# Patient Record
Sex: Female | Born: 1990 | Race: White | Hispanic: No | Smoking: Current every day smoker
Health system: Southern US, Community
[De-identification: ages and names within clinical notes are randomized; demographics above are authoritative.]

## PROBLEM LIST (undated history)

## (undated) HISTORY — PX: ECTOPIC PREGNANCY SURGERY: SHX613

---

## 2015-08-30 ENCOUNTER — Encounter (HOSPITAL_COMMUNITY): Payer: Self-pay

## 2015-08-30 DIAGNOSIS — R112 Nausea with vomiting, unspecified: Secondary | ICD-10-CM | POA: Diagnosis not present

## 2015-08-30 DIAGNOSIS — R42 Dizziness and giddiness: Secondary | ICD-10-CM | POA: Insufficient documentation

## 2015-08-30 DIAGNOSIS — R102 Pelvic and perineal pain: Secondary | ICD-10-CM | POA: Insufficient documentation

## 2015-08-30 DIAGNOSIS — R197 Diarrhea, unspecified: Secondary | ICD-10-CM | POA: Insufficient documentation

## 2015-08-30 DIAGNOSIS — F17200 Nicotine dependence, unspecified, uncomplicated: Secondary | ICD-10-CM | POA: Diagnosis not present

## 2015-08-30 DIAGNOSIS — Z3202 Encounter for pregnancy test, result negative: Secondary | ICD-10-CM | POA: Insufficient documentation

## 2015-08-30 DIAGNOSIS — R1030 Lower abdominal pain, unspecified: Secondary | ICD-10-CM | POA: Diagnosis present

## 2015-08-30 LAB — URINALYSIS, ROUTINE W REFLEX MICROSCOPIC
BILIRUBIN URINE: NEGATIVE
Glucose, UA: NEGATIVE mg/dL
KETONES UR: NEGATIVE mg/dL
Leukocytes, UA: NEGATIVE
NITRITE: NEGATIVE
PH: 5.5 (ref 5.0–8.0)
Protein, ur: NEGATIVE mg/dL
SPECIFIC GRAVITY, URINE: 1.025 (ref 1.005–1.030)

## 2015-08-30 LAB — URINE MICROSCOPIC-ADD ON

## 2015-08-30 LAB — COMPREHENSIVE METABOLIC PANEL
ALBUMIN: 4.6 g/dL (ref 3.5–5.0)
ALK PHOS: 49 U/L (ref 38–126)
ALT: 12 U/L — ABNORMAL LOW (ref 14–54)
AST: 17 U/L (ref 15–41)
Anion gap: 4 — ABNORMAL LOW (ref 5–15)
BILIRUBIN TOTAL: 0.5 mg/dL (ref 0.3–1.2)
BUN: 7 mg/dL (ref 6–20)
CALCIUM: 9.6 mg/dL (ref 8.9–10.3)
CO2: 27 mmol/L (ref 22–32)
Chloride: 105 mmol/L (ref 101–111)
Creatinine, Ser: 0.65 mg/dL (ref 0.44–1.00)
GFR calc Af Amer: >60 mL/min (ref >60)
GFR calc non Af Amer: >60 mL/min (ref >60)
GLUCOSE: 115 mg/dL — AB (ref 65–99)
Potassium: 3.5 mmol/L (ref 3.5–5.1)
SODIUM: 136 mmol/L (ref 135–145)
TOTAL PROTEIN: 7.2 g/dL (ref 6.5–8.1)

## 2015-08-30 LAB — CBC
HEMATOCRIT: 38.9 % (ref 36.0–46.0)
HEMOGLOBIN: 13.3 g/dL (ref 12.0–15.0)
MCH: 30.6 pg (ref 26.0–34.0)
MCHC: 34.2 g/dL (ref 30.0–36.0)
MCV: 89.4 fL (ref 78.0–100.0)
Platelets: 267 10*3/uL (ref 150–400)
RBC: 4.35 MIL/uL (ref 3.87–5.11)
RDW: 12.1 % (ref 11.5–15.5)
WBC: 8.3 10*3/uL (ref 4.0–10.5)

## 2015-08-30 LAB — LIPASE, BLOOD: Lipase: 21 U/L (ref 11–51)

## 2015-08-30 LAB — I-STAT BETA HCG BLOOD, ED (MC, WL, AP ONLY): I-stat hCG, quantitative: <5 m[IU]/mL (ref <5)

## 2015-08-30 NOTE — ED Notes (Signed)
Patient complains of severe lower abdominal pain and cramping since 530pm this evening. Has ovarian cyst but states that this pain is different. Vomited x 1. Some mild dysuria with same

## 2015-08-31 ENCOUNTER — Emergency Department (HOSPITAL_COMMUNITY)
Admission: EM | Admit: 2015-08-31 | Discharge: 2015-08-31 | Disposition: A | Discharge status: Home / Self Care | Payer: BLUE CROSS/BLUE SHIELD | Attending: Emergency Medicine | Admitting: Emergency Medicine

## 2015-08-31 DIAGNOSIS — R102 Pelvic and perineal pain: Secondary | ICD-10-CM

## 2015-08-31 MED ORDER — IBUPROFEN 800 MG PO TABS: 800.0000 mg | ORAL_TABLET | Freq: Three times a day (TID) | ORAL | Status: DC

## 2015-08-31 MED ORDER — OXYCODONE-ACETAMINOPHEN 5-325 MG PO TABS
1.0000 | ORAL_TABLET | Freq: Once | ORAL | Status: AC
Start: 2015-08-31 — End: 2015-08-31
  Administered 2015-08-31: 1 via ORAL
  Filled 2015-08-31: qty 1

## 2015-08-31 MED ORDER — TRAMADOL HCL 50 MG PO TABS: 50.0000 mg | ORAL_TABLET | Freq: Four times a day (QID) | ORAL | Status: DC | PRN

## 2015-08-31 NOTE — ED Provider Notes (Signed)
CSN: [id number]     Arrival date & time 08/30/15  2135 History  By signing my name below, I, Bethel Born, attest that this documentation has been prepared under the direction and in the presence of Gilda Crease, MD. Electronically Signed: Bethel Born, ED Scribe. 08/31/2015. 12:49 AM   Chief Complaint  Patient presents with  . Abdominal Pain   The history is provided by the patient. No language interpreter was used.   Samantha Stevens is a 25 y.o. female who presents to the Emergency Department complaining of severe, cramping, lower abdominal pain with onset gradual onset approximately 7 hours ago at work. Pt states that the pain feels similar to what she had with ovarian cysts in 2013. She took ibuprofen PTA with some pain relief.  Associated symptoms include dizziness, nausea and vomiting (when the pain was most severe), and diarrhea. Pt denies dysuria. She is currently menstruating.   History reviewed. No pertinent past medical history. History reviewed. No pertinent past surgical history. No family history on file. Social History  Substance Use Topics  . Smoking status: Current Every Day Smoker  . Smokeless tobacco: None  . Alcohol Use: None   OB History    No data available     Review of Systems  Gastrointestinal: Positive for nausea, vomiting, abdominal pain and diarrhea.  Genitourinary: Negative for dysuria.  Neurological: Positive for dizziness.  All other systems reviewed and are negative.  Allergies  Review of patient's allergies indicates no known allergies.  Home Medications   Prior to Admission medications   Medication Sig Start Date End Date Taking? Authorizing Provider  ibuprofen (ADVIL,MOTRIN) 800 MG tablet Take 1 tablet (800 mg total) by mouth 3 (three) times daily. 08/31/15   Gilda Crease, MD  traMADol (ULTRAM) 50 MG tablet Take 1 tablet (50 mg total) by mouth every 6 (six) hours as needed. 08/31/15   Gilda Crease, MD   BP 109/66  mmHg  Pulse 53  Temp(Src) 98.1 F (36.7 C) (Oral)  Resp 16  Ht  (1.676 m)  Wt 126 lb 9 oz (57.408 kg)  BMI 20.44 kg/m2  SpO2 100%  LMP 08/03/2015 Physical Exam  Constitutional: She is oriented to person, place, and time. She appears well-developed and well-nourished. No distress.  HENT:  Head: Normocephalic and atraumatic.  Right Ear: Hearing normal.  Left Ear: Hearing normal.  Nose: Nose normal.  Mouth/Throat: Oropharynx is clear and moist and mucous membranes are normal.  Eyes: Conjunctivae and EOM are normal. Pupils are equal, round, and reactive to light.  Neck: Normal range of motion. Neck supple.  Cardiovascular: Regular rhythm, S1 normal and S2 normal.  Exam reveals no gallop and no friction rub.   No murmur heard. Pulmonary/Chest: Effort normal and breath sounds normal. No respiratory distress. She exhibits no tenderness.  Abdominal: Soft. Normal appearance and bowel sounds are normal. There is no hepatosplenomegaly. There is tenderness. There is no rebound, no guarding, no tenderness at McBurney's point and negative Murphy's sign. No hernia.  Mild suprapubic tenderness   Musculoskeletal: Normal range of motion.  Neurological: She is alert and oriented to person, place, and time. She has normal strength. No cranial nerve deficit or sensory deficit. Coordination normal. GCS eye subscore is 4. GCS verbal subscore is 5. GCS motor subscore is 6.  Skin: Skin is warm, dry and intact. No rash noted. No cyanosis.  Psychiatric: She has a normal mood and affect. Her speech is normal and behavior is normal. Thought  content normal.  Nursing note and vitals reviewed.   ED Course  Procedures (including critical care time) DIAGNOSTIC STUDIES: Oxygen Saturation is 100% on RA,  normal by my interpretation.    COORDINATION OF CARE: 12:29 AM Discussed treatment plan which includes lab work and pain medication with pt at bedside. Pt declines a pelvic exam.   Labs Review Labs  Reviewed  COMPREHENSIVE METABOLIC PANEL - Abnormal; Notable for the following:    Glucose, Bld 115 (*)    ALT 12 (*)    Anion gap 4 (*)    All other components within normal limits  URINALYSIS, ROUTINE W REFLEX MICROSCOPIC (NOT AT [facility]) - Abnormal; Notable for the following:    Hgb urine dipstick LARGE (*)    All other components within normal limits  URINE MICROSCOPIC-ADD ON - Abnormal; Notable for the following:    Squamous Epithelial / LPF 0-5 (*)    Bacteria, UA RARE (*)    All other components within normal limits  LIPASE, BLOOD  CBC  I-STAT BETA HCG BLOOD, ED ([facility], [facility], [facility] ONLY)    Imaging Review No results found. I have personally reviewed and evaluated these lab results as part of my medical decision-making.   EKG Interpretation None      MDM   Final diagnoses:  Pelvic pain in female    She presents to the emergency department with complaints of lower abdominal and pelvic pain. Symptoms began around 5:30 PM this evening. She reports sharp, stabbing pain that was severe. She did take ibuprofen, eventually the pain eased up on its own. She has a history of ovarian cysts. She reports that the pain that she was experiencing tonight was identical to what she has had with ovarian cyst, but it has been several years since she has had a cyst. Patient is feeling much better now. Examination is benign. She does not have any right lower quadrant tenderness. There is no signs of peritonitis. Patient has declined pelvic exam. She wishes to be discharged at this time because she is certain that it was her ovarian cyst and she is feeling better. I did discuss with her the fact that we have not finished the workup and cannot know that this was an ovarian cyst 100% certainty. I do not feel that she has any signs or symptoms that this wouldn't have indicated appendicitis at this time. Patient will therefore be discharged and was counseled to return to the ER if she develops fever, worsening  pain, lightheadedness, palpitations or dizziness/passing out. Otherwise she will follow-up with OB/GYN.  I personally performed the services described in this documentation, which was scribed in my presence. The recorded information has been reviewed and is accurate.    Gilda Creasehristopher J Pollina, MD 08/31/15 [phone]

## 2015-08-31 NOTE — Discharge Instructions (Signed)

## 2016-07-17 ENCOUNTER — Encounter (HOSPITAL_COMMUNITY): Payer: Self-pay | Admitting: Emergency Medicine

## 2016-07-17 ENCOUNTER — Emergency Department (HOSPITAL_COMMUNITY)
Admission: EM | Admit: 2016-07-17 | Discharge: 2016-07-17 | Disposition: A | Discharge status: Home / Self Care | Payer: Medicaid Other | Attending: Emergency Medicine | Admitting: Emergency Medicine

## 2016-07-17 ENCOUNTER — Emergency Department (HOSPITAL_COMMUNITY): Payer: Medicaid Other

## 2016-07-17 DIAGNOSIS — Z79899 Other long term (current) drug therapy: Secondary | ICD-10-CM | POA: Insufficient documentation

## 2016-07-17 DIAGNOSIS — O200 Threatened abortion: Secondary | ICD-10-CM | POA: Insufficient documentation

## 2016-07-17 DIAGNOSIS — F17200 Nicotine dependence, unspecified, uncomplicated: Secondary | ICD-10-CM | POA: Diagnosis not present

## 2016-07-17 DIAGNOSIS — Z3A11 11 weeks gestation of pregnancy: Secondary | ICD-10-CM | POA: Diagnosis not present

## 2016-07-17 DIAGNOSIS — O99331 Smoking (tobacco) complicating pregnancy, first trimester: Secondary | ICD-10-CM | POA: Insufficient documentation

## 2016-07-17 DIAGNOSIS — N939 Abnormal uterine and vaginal bleeding, unspecified: Secondary | ICD-10-CM

## 2016-07-17 LAB — HCG, QUANTITATIVE, PREGNANCY: hCG, Beta Chain, Quant, S: 48284 m[IU]/mL — ABNORMAL HIGH (ref <5)

## 2016-07-17 NOTE — ED Notes (Signed)
US is at bedside.

## 2016-07-17 NOTE — ED Triage Notes (Addendum)
Patient states that she is 11wk 3days pregnant who started having lower abd pain and some vaginal bleeding last night. Pain is worse when moving around and not getting any better so wanted to be seen. Patient states that she filled a panty liner in about 3 hours, but has stopped now, but pain is worse. Patient had PMH ectopic pregnancy and miscarriages.

## 2016-07-17 NOTE — Discharge Instructions (Signed)
Call your OB o Monday to update on your condition. Vaginal bleeding during early pregnancy can stop, or it can worsen resulting in miscarriage. Recheck with OB, or [facility] with heavy bleeding, dizziness, or worsening.

## 2016-07-17 NOTE — ED Notes (Signed)
Pt refused to have her blood drawn

## 2016-07-18 NOTE — ED Provider Notes (Signed)
[facility]-EMERGENCY DEPT Provider Note   CSN: [id number] Arrival date & time: 07/17/16  1643     History   Chief Complaint Chief Complaint  Patient presents with  . Abdominal Pain  . Vaginal Bleeding  . [redacted] weeks pregnant    HPI Samantha Stevens is a 26 y.o. female. Is pregnant, and vaginal bleeding  HPI:  Patient presents for evaluation. States she is [redacted] weeks pregnant. Had some lower abdominal pain today. Had a small amount of vaginal bleeding. Patient is positive by history. She has this in her "my chart". Her symptoms have nearly resolved.  Patient had a quantitative hCG drawn at triage which is 48,000.  Due to the high number of high acuity patients in the department the patient had a prolonged wait. As I examine her she declines pelvic exam or additional blood draw. However she was able to confirm Rh as above  History reviewed. No pertinent past medical history.  There are no active problems to display for this patient.   Past Surgical History:  Procedure Laterality Date  . ECTOPIC PREGNANCY SURGERY      OB History    Gravida Para Term Preterm AB Living   1             SAB TAB Ectopic Multiple Live Births                   Home Medications    Prior to Admission medications   Medication Sig Start Date End Date Taking? Authorizing Provider  acetaminophen (TYLENOL) 500 MG tablet Take 500 mg by mouth every 6 (six) hours as needed for headache.   Yes Historical Provider, MD  Prenatal Vit-Fe Fumarate-FA (PRENATAL MULTIVITAMIN) TABS tablet Take 1 tablet by mouth daily at 12 noon.   Yes Historical Provider, MD    Family History No family history on file.  Social History Social History  Substance Use Topics  . Smoking status: Current Every Day Smoker  . Smokeless tobacco: Never Used  . Alcohol use No     Allergies   Other   Review of Systems Review of Systems  Constitutional: Negative for appetite change, chills, diaphoresis, fatigue and fever.  HENT:  Negative for mouth sores, sore throat and trouble swallowing.   Eyes: Negative for visual disturbance.  Respiratory: Negative for cough, chest tightness, shortness of breath and wheezing.   Cardiovascular: Negative for chest pain.  Gastrointestinal: Negative for abdominal distention, abdominal pain, diarrhea, nausea and vomiting.  Endocrine: Negative for polydipsia, polyphagia and polyuria.  Genitourinary: Positive for pelvic pain and vaginal bleeding. Negative for dysuria, frequency and hematuria.  Musculoskeletal: Negative for gait problem.  Skin: Negative for color change, pallor and rash.  Neurological: Negative for dizziness, syncope, light-headedness and headaches.  Hematological: Does not bruise/bleed easily.  Psychiatric/Behavioral: Negative for behavioral problems and confusion.     Physical Exam Updated Vital Signs BP (!) 144/69   Pulse 72   Temp 99.1 F (37.3 C) (Oral)   Resp 14   Ht  (1.676 m)   Wt 138 lb 6 oz (62.8 kg)   LMP 04/28/2016   SpO2 99%   BMI 22.33 kg/m   Physical Exam  Constitutional: She is oriented to person, place, and time. She appears well-developed and well-nourished. No distress.  HENT:  Head: Normocephalic.  Eyes: Conjunctivae are normal. Pupils are equal, round, and reactive to light. No scleral icterus.  Neck: Normal range of motion. Neck supple. No thyromegaly present.  Cardiovascular: Normal rate  and regular rhythm.  Exam reveals no gallop and no friction rub.   No murmur heard. Pulmonary/Chest: Effort normal and breath sounds normal. No respiratory distress. She has no wheezes. She has no rales.  Abdominal: Soft. Bowel sounds are normal. She exhibits no distension. There is no tenderness. There is no rebound.  Musculoskeletal: Normal range of motion.  Neurological: She is alert and oriented to person, place, and time.  Skin: Skin is warm and dry. No rash noted.  Psychiatric: She has a normal mood and affect. Her behavior is normal.       ED Treatments / Results  Labs (all labs ordered are listed, but only abnormal results are displayed) Labs Reviewed  HCG, QUANTITATIVE, PREGNANCY - Abnormal; Notable for the following:       Result Value   hCG, Beta Chain, Quant, S 48,284 (*)    All other components within normal limits  CBC WITH DIFFERENTIAL/PLATELET  BASIC METABOLIC PANEL  ABO/RH    EKG  EKG Interpretation None       Radiology US Ob Comp Less 14 Wks  Result Date: 07/17/2016 CLINICAL DATA:  Pelvic pain with nausea.  Vaginal bleeding. EXAM: OBSTETRIC <14 WK ULTRASOUND TECHNIQUE: Transabdominal ultrasound was performed for evaluation of the gestation as well as the maternal uterus and adnexal regions. COMPARISON:  None. FINDINGS: Intrauterine gestational sac: Single Yolk sac:  Visualized. Embryo:  Visualized. Cardiac Activity: Visualized. Heart Rate: 158 bpm CRL:   49  mm   11 w 5 d                  [place] EDC: 01/31/2017 Subchorionic hemorrhage:  Small Maternal uterus/adnexae: Probable corpus luteum cyst left ovary. No substantial intraperitoneal free fluid. IMPRESSION: Single living intrauterine gestation at 42 week 5 day gestational age by crown-rump length. Electronically Signed   By: Kennith Center M.D.   On: 07/17/2016 21:47    Procedures Procedures (including critical care time)  Medications Ordered in ED Medications - No data to display   Initial Impression / Assessment and Plan / ED Course  I have reviewed the triage vital signs and the nursing notes.  Pertinent labs & imaging results that were available during my care of the patient were reviewed by me and considered in my medical decision making (see chart for details).     Sounds shows very small chorionic hemorrhage. Probable corpus luteum cyst. Intrauterine gestational weeks 5 days with movement and heart tones.  Discussed possibility of progressing normal pregnancy. We discussed possibility of progression with heavier bleeding and symptoms  and miscarriage. I have directed her to her primary OB, or [facility] if any worsening symptoms however, with a sudden heavy bleeding lightheadedness of asked her to present here still emergency room as needed. Also discussed with her possibility of completing miscarriage at home should her symptoms persist. I have asked her to contact her OB tomorrow for an update.  Final Clinical Impressions(s) / ED Diagnoses   Final diagnoses:  Vaginal bleeding  Threatened miscarriage    New Prescriptions Discharge Medication List as of 07/17/2016 10:11 PM       Rolland Porter, MD 07/18/16 [phone]

## 2017-07-04 IMAGING — US US OB COMP LESS 14 WK
1 series · 14 of 28 positions shown · non-contrast
Comparison: None.

CLINICAL DATA: Pelvic pain with nausea.  Vaginal bleeding.

EXAM:
OBSTETRIC <14 WK ULTRASOUND
TECHNIQUE: Transabdominal ultrasound was performed for evaluation of the
gestation as well as the maternal uterus and adnexal regions.

[Series 1: us ob comp less 14 wk · 0.19mm/px · 14 of 31 slices shown]
[im 2/31]
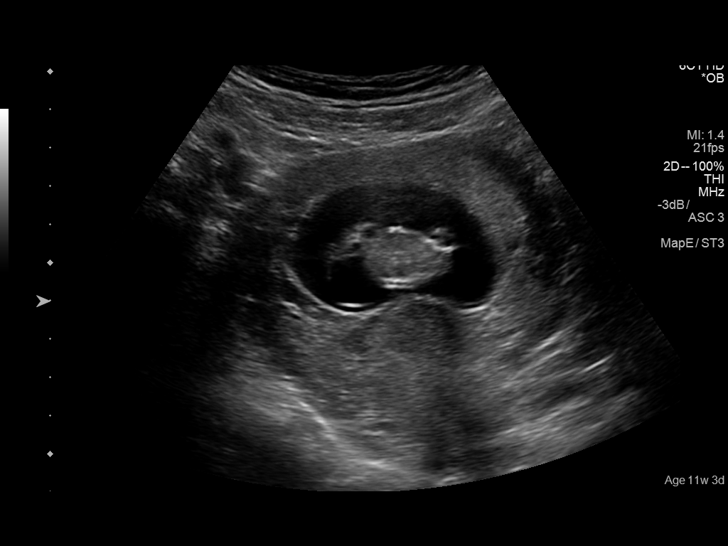
[im 4/31]
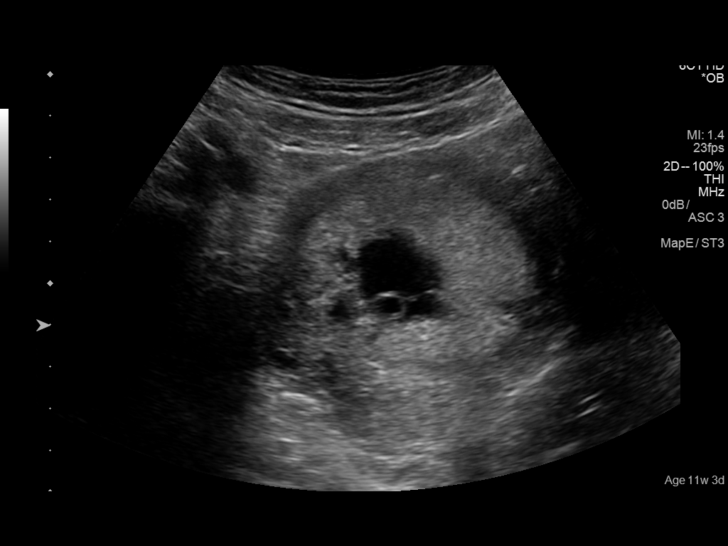
[im 6/31]
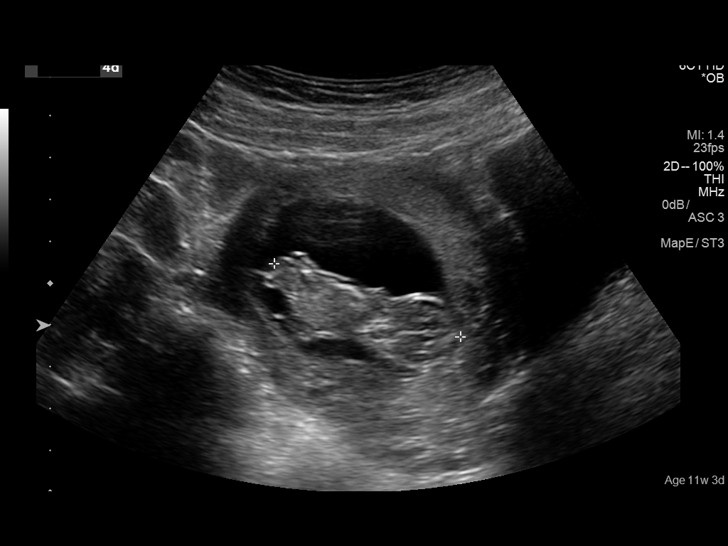
[im 8/31]
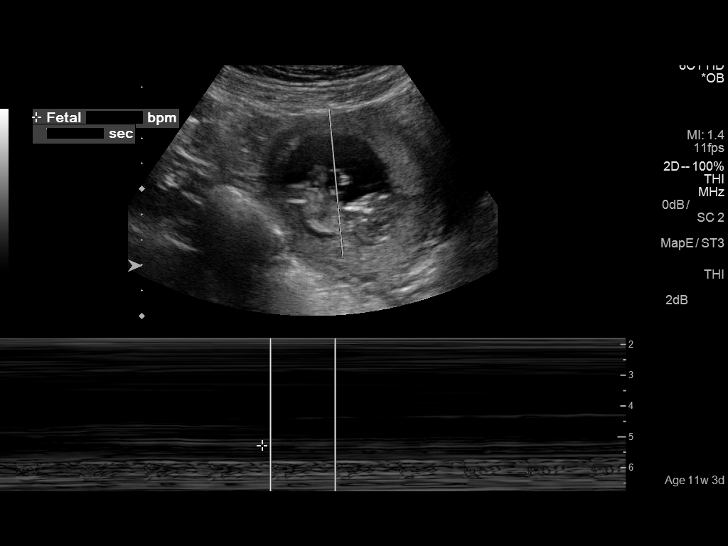
[im 11/31]
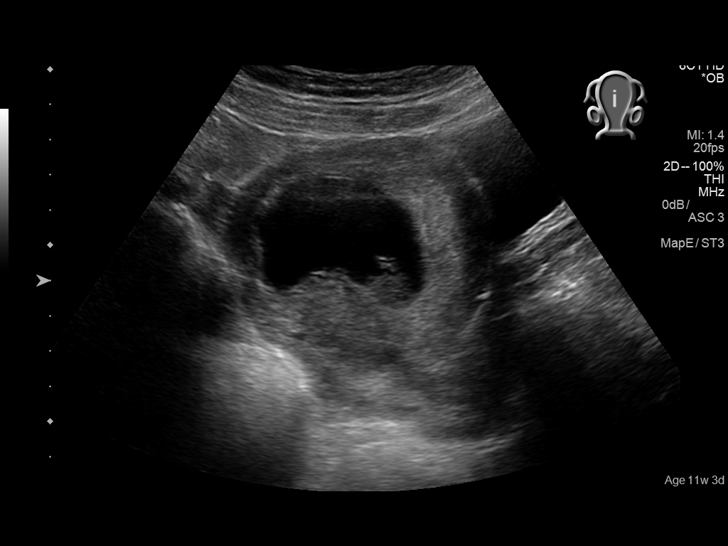
[im 13/31]
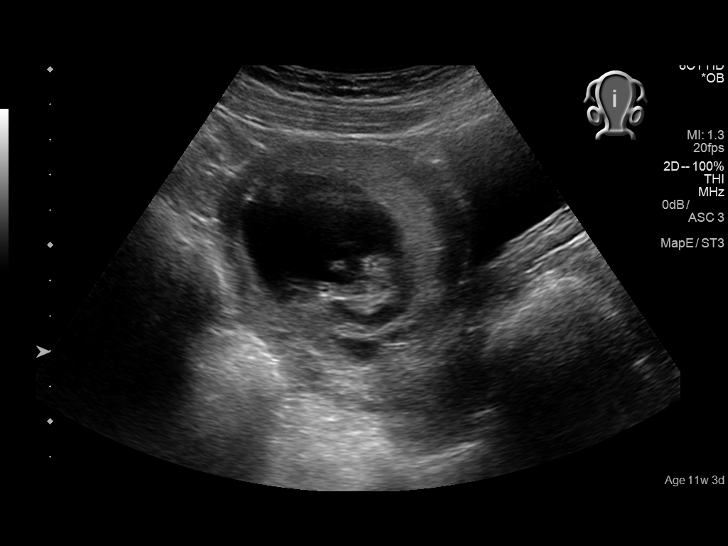
[im 15/31]
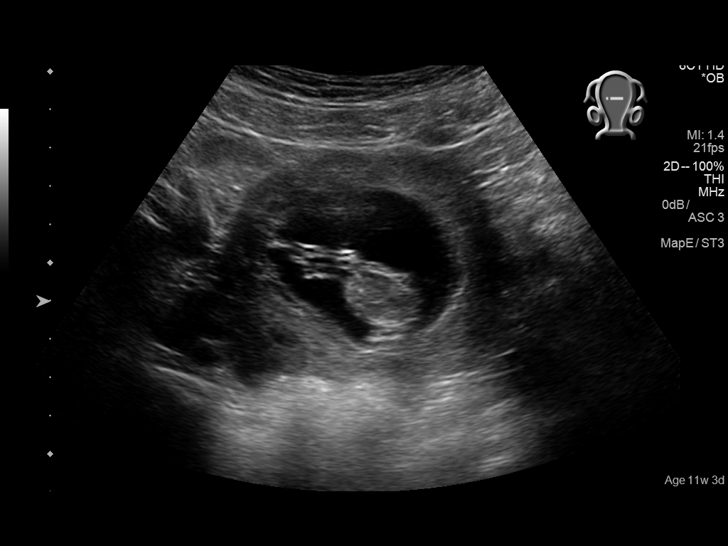
[im 17/31]
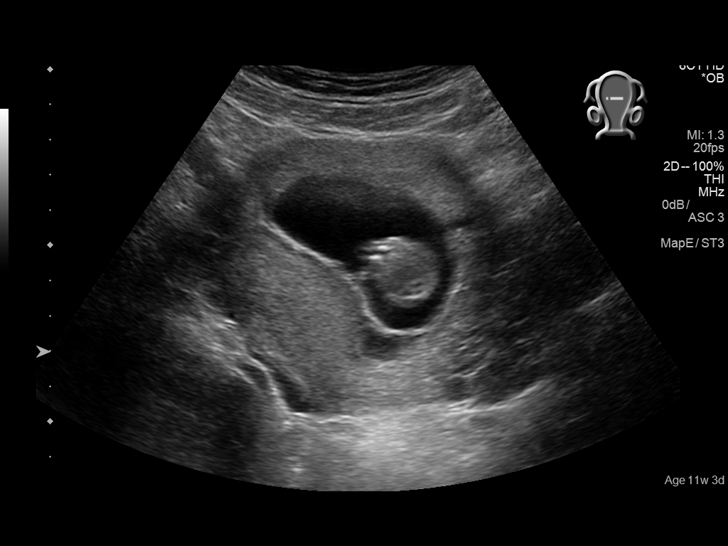
[im 19/31]
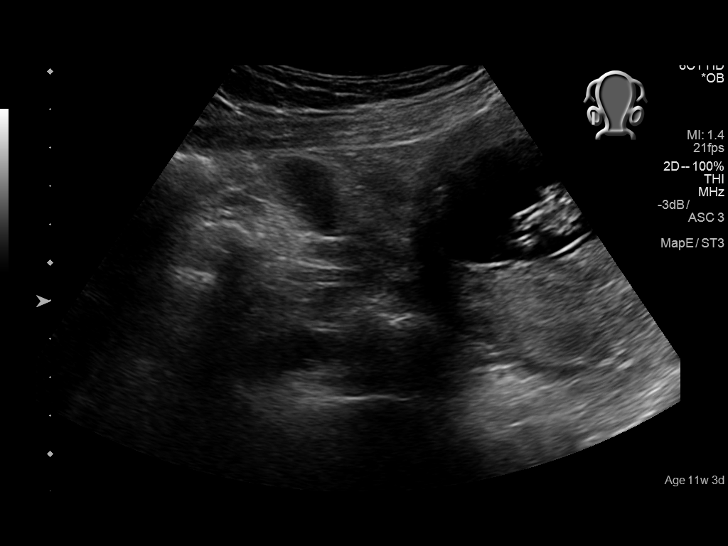
[im 22/31]
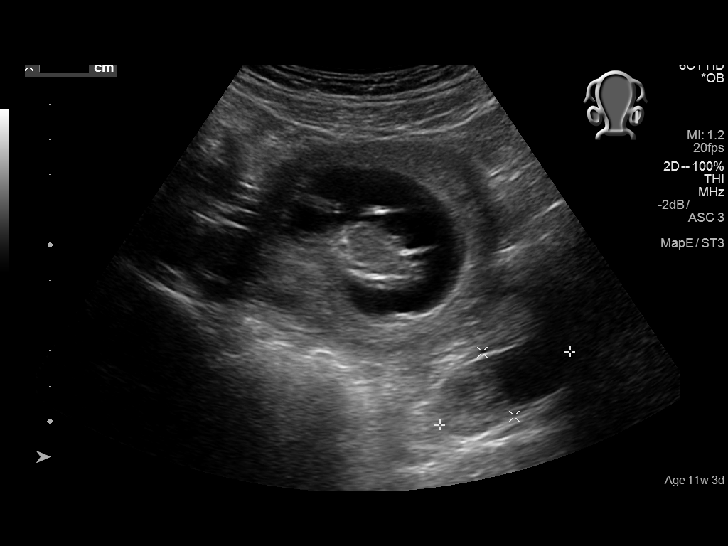
[im 24/31]
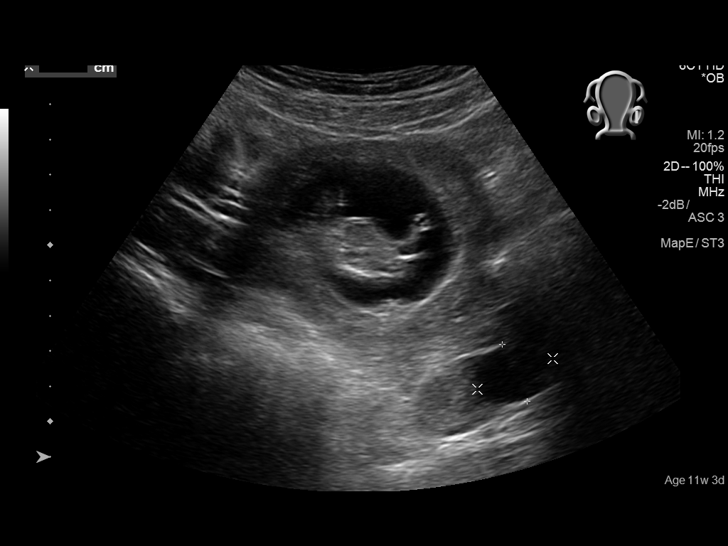
[im 26/31]
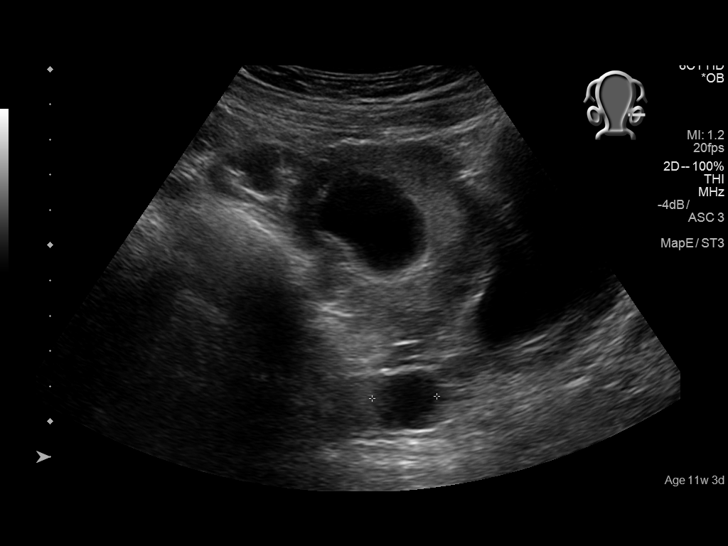
[im 28/31]
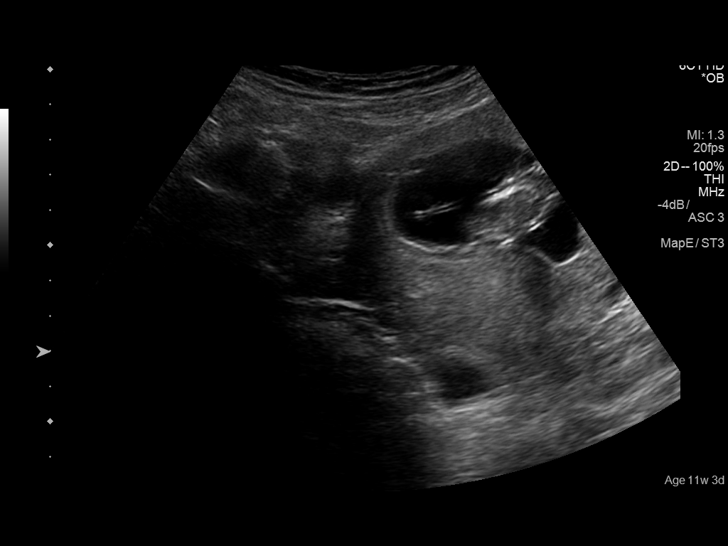
[im 31/31]
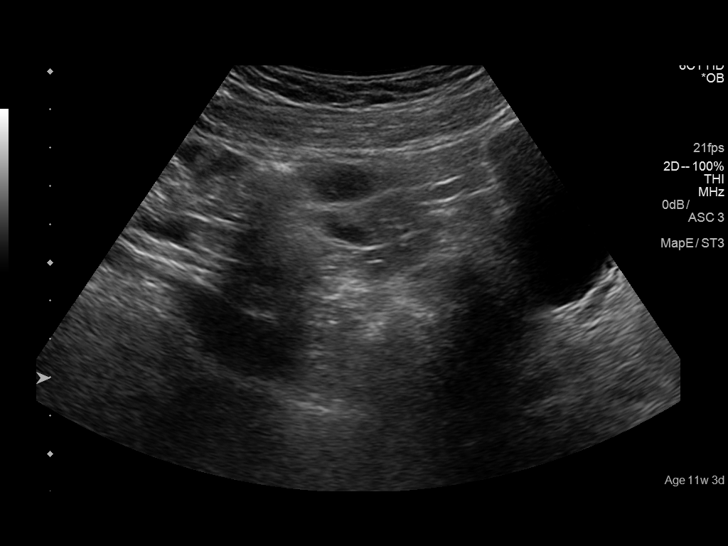

[14 of 28 positions shown; findings below may reference images not displayed]

FINDINGS: Intrauterine gestational sac: Single

Yolk sac:  Visualized.

Embryo:  Visualized.

Cardiac Activity: Visualized.

Heart Rate: 158 bpm

CRL:   49  mm   11 w 5 d                  [place] EDC: 01/31/2017

Subchorionic hemorrhage:  Small

Maternal uterus/adnexae: Probable corpus luteum cyst left ovary. No
substantial intraperitoneal free fluid.
IMPRESSION: Single living intrauterine gestation at 11 week 5 day gestational
age by crown-rump length.
